# Patient Record
Sex: Female | Born: 1935 | Race: White | Hispanic: No
Health system: Southern US, Community
[De-identification: ages and names within clinical notes are randomized; demographics above are authoritative.]

---

## 2004-02-02 ENCOUNTER — Other Ambulatory Visit: Payer: Self-pay

## 2004-09-13 ENCOUNTER — Emergency Department: Payer: Self-pay | Admitting: Emergency Medicine

## 2005-06-17 ENCOUNTER — Ambulatory Visit: Payer: Self-pay | Admitting: Anesthesiology

## 2005-07-25 ENCOUNTER — Emergency Department: Payer: Self-pay | Admitting: Internal Medicine

## 2005-08-16 ENCOUNTER — Ambulatory Visit: Payer: Self-pay | Admitting: Anesthesiology

## 2005-08-26 ENCOUNTER — Ambulatory Visit: Payer: Self-pay | Admitting: Anesthesiology

## 2005-10-05 ENCOUNTER — Ambulatory Visit: Payer: Self-pay | Admitting: Anesthesiology

## 2005-11-30 ENCOUNTER — Emergency Department: Payer: Self-pay | Admitting: General Practice

## 2005-12-14 ENCOUNTER — Ambulatory Visit: Payer: Self-pay | Admitting: Anesthesiology

## 2006-02-24 ENCOUNTER — Ambulatory Visit: Payer: Self-pay | Admitting: Anesthesiology

## 2006-04-05 ENCOUNTER — Ambulatory Visit: Payer: Self-pay | Admitting: Anesthesiology

## 2006-05-12 ENCOUNTER — Ambulatory Visit: Payer: Self-pay | Admitting: Anesthesiology

## 2006-07-04 ENCOUNTER — Emergency Department: Payer: Self-pay | Admitting: Emergency Medicine

## 2006-07-05 ENCOUNTER — Other Ambulatory Visit: Payer: Self-pay

## 2006-11-09 ENCOUNTER — Ambulatory Visit: Payer: Self-pay | Admitting: Anesthesiology

## 2006-11-14 ENCOUNTER — Other Ambulatory Visit: Payer: Self-pay

## 2006-11-14 ENCOUNTER — Inpatient Hospital Stay: Payer: Self-pay | Admitting: Internal Medicine

## 2007-01-13 ENCOUNTER — Inpatient Hospital Stay: Payer: Self-pay | Admitting: Internal Medicine

## 2007-01-13 ENCOUNTER — Other Ambulatory Visit: Payer: Self-pay

## 2007-01-15 ENCOUNTER — Other Ambulatory Visit: Payer: Self-pay

## 2007-05-28 ENCOUNTER — Other Ambulatory Visit: Payer: Self-pay

## 2007-05-28 ENCOUNTER — Inpatient Hospital Stay: Payer: Self-pay | Admitting: Internal Medicine

## 2007-05-29 ENCOUNTER — Other Ambulatory Visit: Payer: Self-pay

## 2007-06-08 ENCOUNTER — Other Ambulatory Visit: Payer: Self-pay

## 2007-06-08 ENCOUNTER — Inpatient Hospital Stay: Payer: Self-pay | Admitting: Internal Medicine

## 2007-07-06 ENCOUNTER — Other Ambulatory Visit: Payer: Self-pay

## 2007-07-07 ENCOUNTER — Inpatient Hospital Stay: Payer: Self-pay | Admitting: Internal Medicine

## 2007-07-08 ENCOUNTER — Other Ambulatory Visit: Payer: Self-pay

## 2007-07-09 ENCOUNTER — Other Ambulatory Visit: Payer: Self-pay

## 2007-09-05 ENCOUNTER — Other Ambulatory Visit: Payer: Self-pay

## 2007-09-05 ENCOUNTER — Inpatient Hospital Stay: Payer: Self-pay | Admitting: Internal Medicine

## 2009-06-01 IMAGING — US US CAROTID DUPLEX BILAT
1 series · 17 of 24 positions shown · non-contrast
Comparison: none

REASON FOR EXAM: cva
COMMENTS:

[Series 1: us carotid duplex bilat · 17 of 71 slices shown]
[im 1/71]
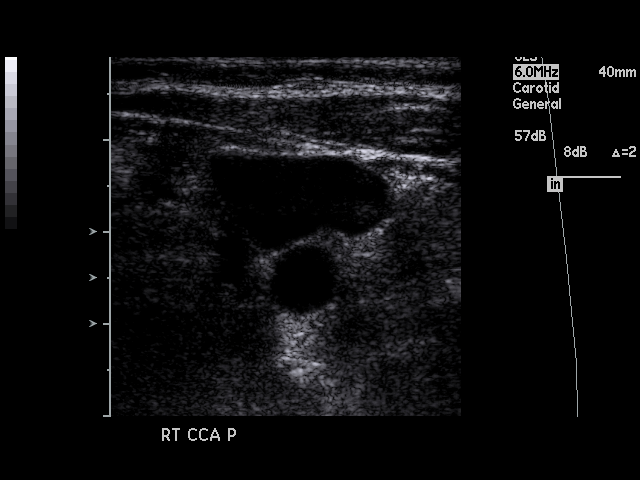
[im 7/71]
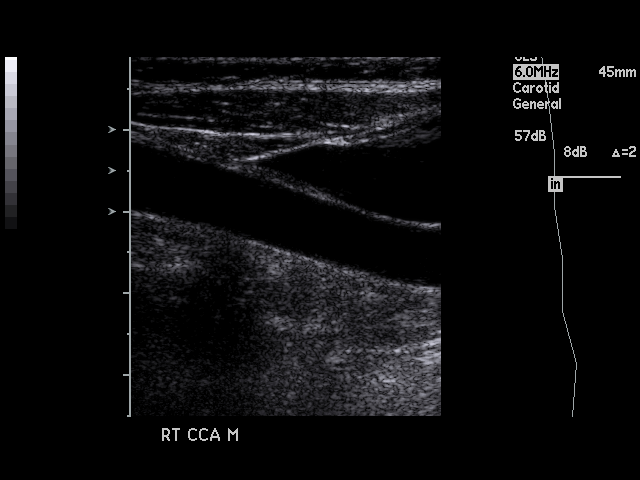
[im 10/71]
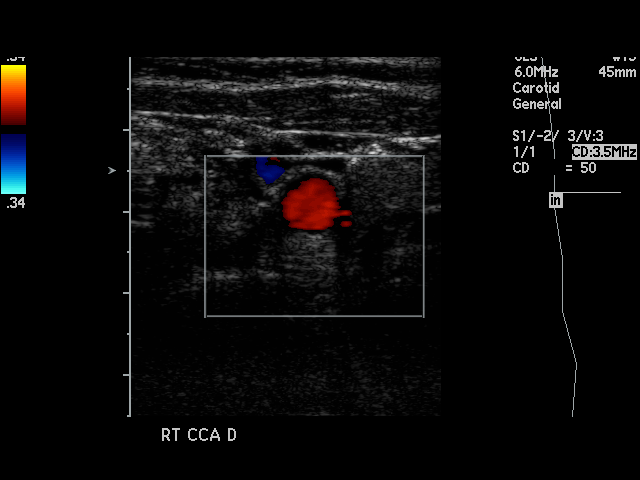
[im 13/71]
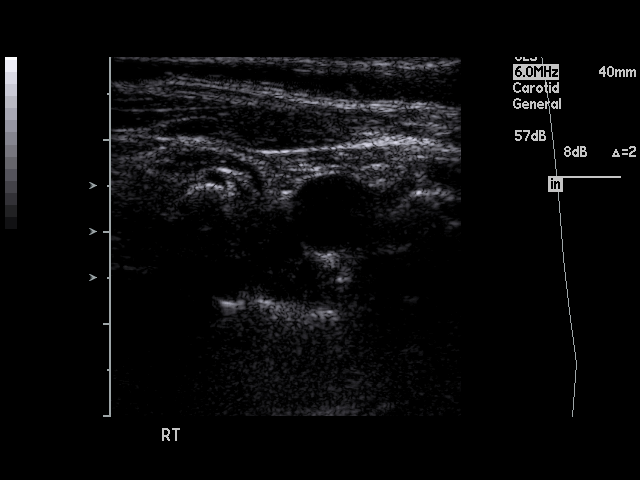
[im 19/71]
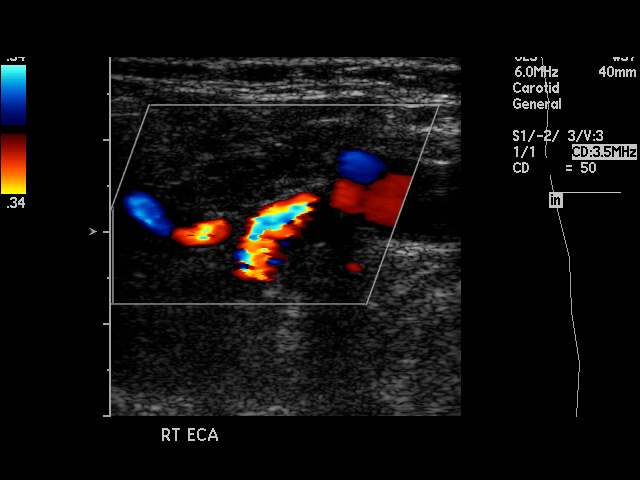
[im 22/71]
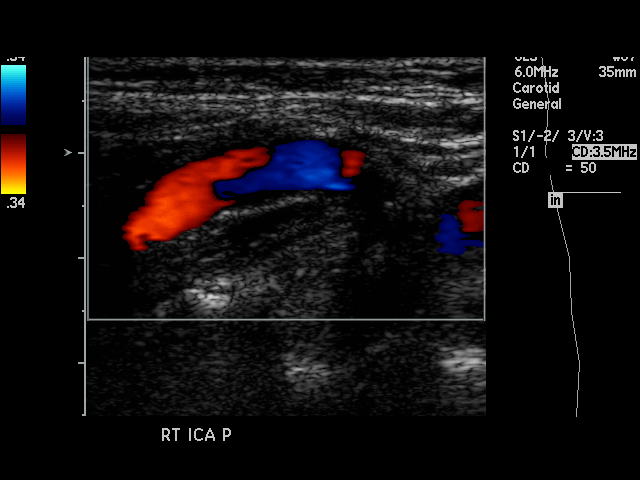
[im 28/71]
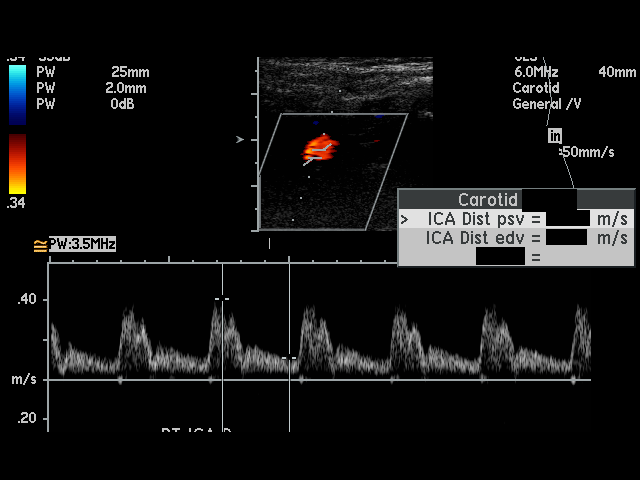
[im 31/71]
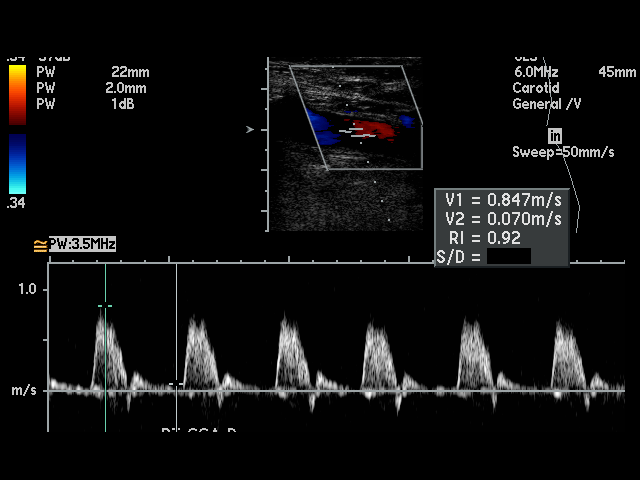
[im 37/71]
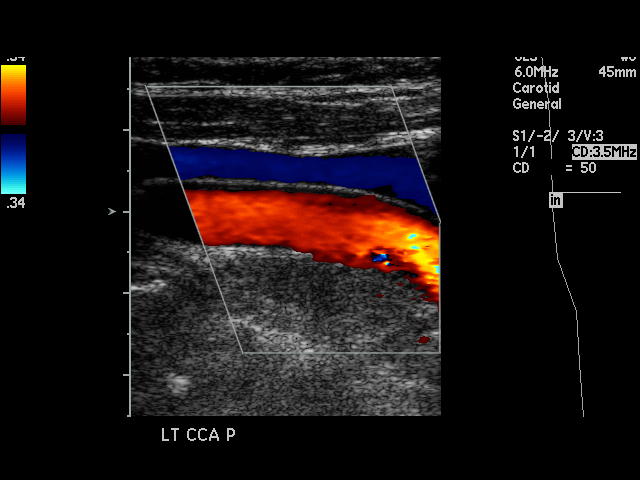
[im 40/71]
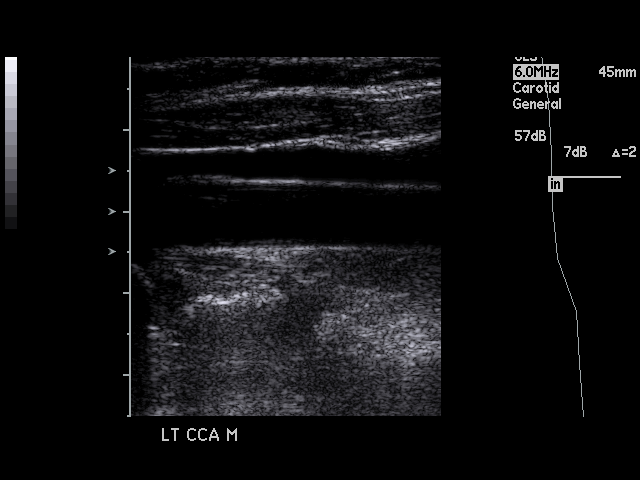
[im 43/71]
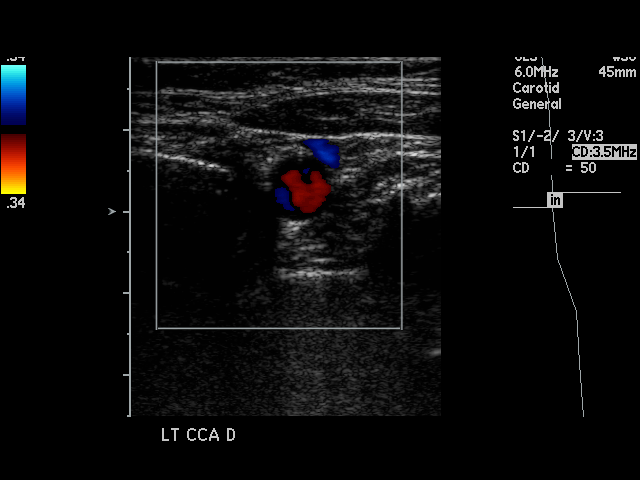
[im 49/71]
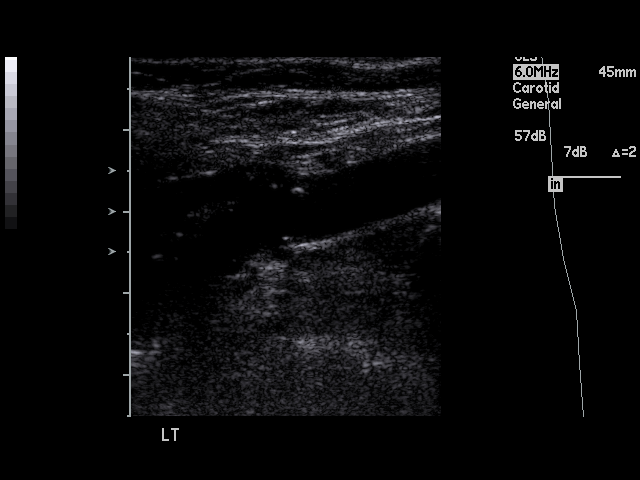
[im 52/71]
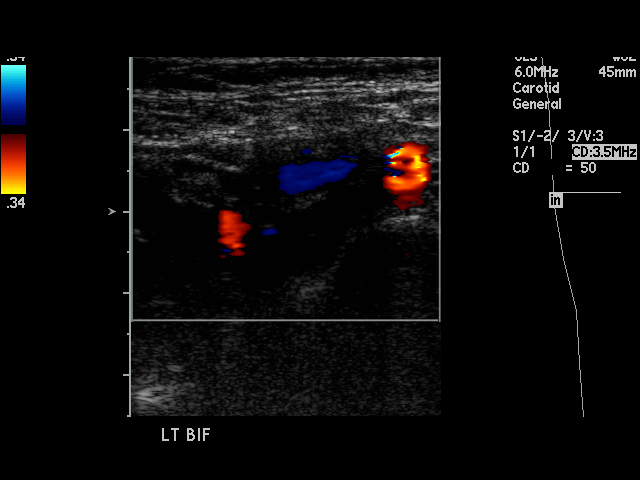
[im 58/71]
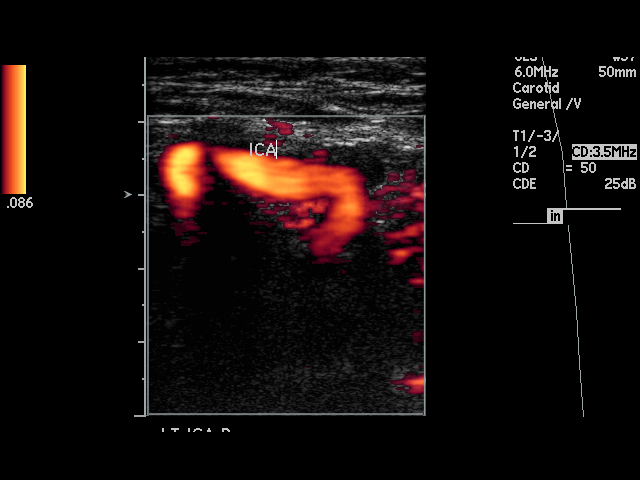
[im 61/71]
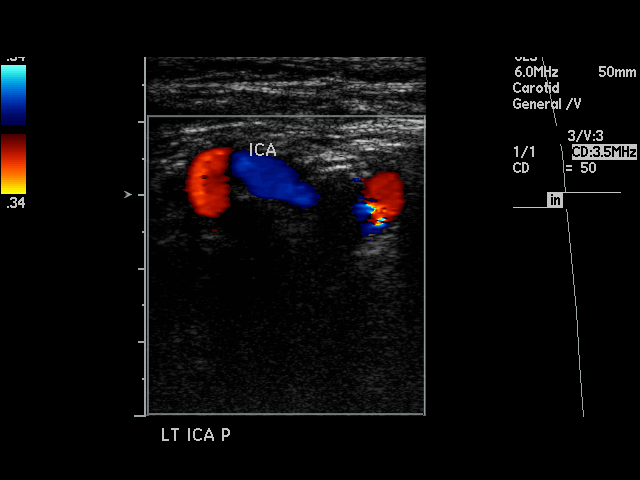
[im 64/71]
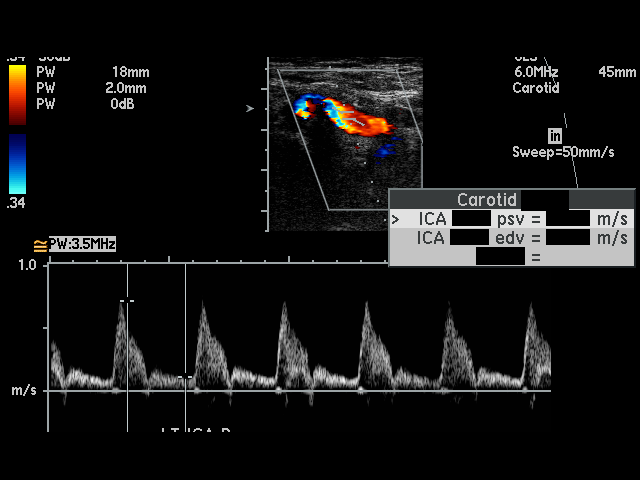
[im 71/71]
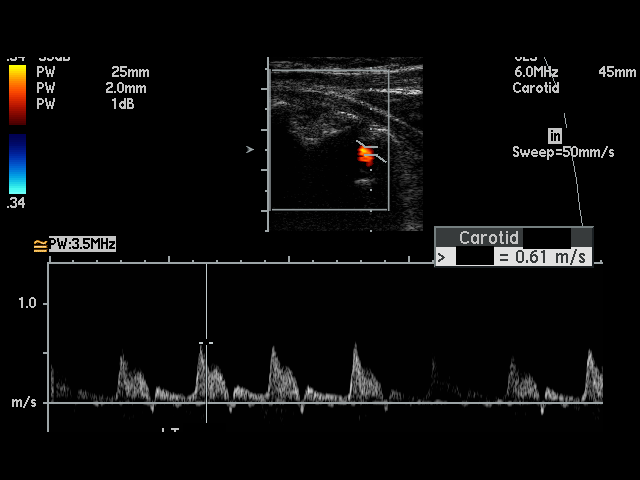

[17 of 24 positions shown; findings below may reference images not displayed]

PROCEDURE:     US  - US CAROTID DOPPLER BILATERAL  - May 29, 2007  [DATE]

RESULT:     There is noted slight calcific plaque formation about the
carotid bifurcations bilaterally.

On the RIGHT, the peak RIGHT common artery flow velocity measures
meter/second and the peak RIGHT common carotid artery flow velocity measures
0.638 meters/sec. IC/CC ratio is 0.750.

On the LEFT, the peak LEFT common carotid artery flow velocity measures
1.190 meters/second and the peak LEFT internal carotid artery flow velocity
measures 0.765 meters/second.  IC/CC ratio is 0.643. These values
bilaterally are in the normal range and consistent with the absence of
hemodynamically significant stenosis. Antegrade flow is noted in both
vertebrals.
IMPRESSION: 1.     Slight plaque formation is observed bilaterally.
2.     No hemodynamically significant stenosis is identified on either side.
3.     Antegrade flow is present in both vertebrals.

## 2010-05-13 ENCOUNTER — Emergency Department: Payer: Self-pay | Admitting: Emergency Medicine

## 2010-06-17 ENCOUNTER — Emergency Department: Payer: Self-pay | Admitting: Emergency Medicine

## 2011-05-24 ENCOUNTER — Ambulatory Visit: Payer: Self-pay | Admitting: Family Medicine

## 2011-05-28 ENCOUNTER — Ambulatory Visit: Payer: Self-pay | Admitting: Internal Medicine

## 2011-11-16 ENCOUNTER — Ambulatory Visit: Payer: Self-pay | Admitting: General Surgery

## 2014-10-23 ENCOUNTER — Emergency Department: Payer: Self-pay | Admitting: Emergency Medicine

## 2014-10-23 LAB — CBC
HCT: 39.7 % (ref 35.0–47.0)
HGB: 12.7 g/dL (ref 12.0–16.0)
MCH: 29.4 pg (ref 26.0–34.0)
MCHC: 32.1 g/dL (ref 32.0–36.0)
MCV: 92 fL (ref 80–100)
Platelet: 187 10*3/uL (ref 150–440)
RBC: 4.34 10*6/uL (ref 3.80–5.20)
RDW: 14.2 % (ref 11.5–14.5)
WBC: 8.8 10*3/uL (ref 3.6–11.0)

## 2014-10-23 LAB — BASIC METABOLIC PANEL
Anion Gap: 6 — ABNORMAL LOW (ref 7–16)
BUN: 15 mg/dL (ref 7–18)
Calcium, Total: 9 mg/dL (ref 8.5–10.1)
Chloride: 102 mmol/L (ref 98–107)
Co2: 29 mmol/L (ref 21–32)
Creatinine: 0.88 mg/dL (ref 0.60–1.30)
EGFR (African American): >60
EGFR (Non-African Amer.): >60
GLUCOSE: 90 mg/dL (ref 65–99)
Osmolality: 274 (ref 275–301)
Potassium: 4.1 mmol/L (ref 3.5–5.1)
SODIUM: 137 mmol/L (ref 136–145)

## 2014-10-23 LAB — URINALYSIS, COMPLETE
BILIRUBIN, UR: NEGATIVE
Glucose,UR: NEGATIVE mg/dL (ref 0–75)
Hyaline Cast: 5
Ketone: NEGATIVE
Leukocyte Esterase: NEGATIVE
Nitrite: NEGATIVE
Ph: 7 (ref 4.5–8.0)
Protein: NEGATIVE
RBC,UR: 1 /HPF (ref 0–5)
Specific Gravity: 1.011 (ref 1.003–1.030)

## 2014-10-23 LAB — PROTIME-INR
INR: 1
Prothrombin Time: 12.9 secs (ref 11.5–14.7)

## 2014-10-23 LAB — TROPONIN I: Troponin-I: 0.03 ng/mL

## 2015-01-06 ENCOUNTER — Ambulatory Visit: Payer: Self-pay | Admitting: Internal Medicine

## 2015-01-15 ENCOUNTER — Inpatient Hospital Stay: Payer: Self-pay | Admitting: Internal Medicine

## 2015-01-25 ENCOUNTER — Ambulatory Visit: Payer: Self-pay | Admitting: Family Medicine

## 2015-01-25 ENCOUNTER — Emergency Department: Payer: Self-pay | Admitting: Emergency Medicine

## 2015-01-26 ENCOUNTER — Emergency Department: Payer: Self-pay | Admitting: Emergency Medicine

## 2015-02-04 ENCOUNTER — Ambulatory Visit
Admit: 2015-02-04 | Disposition: A | Discharge status: Home / Self Care | Payer: Self-pay | Attending: Internal Medicine | Admitting: Internal Medicine

## 2015-02-27 ENCOUNTER — Emergency Department: Payer: Self-pay | Admitting: Emergency Medicine

## 2015-02-27 LAB — BASIC METABOLIC PANEL
ANION GAP: 9 (ref 7–16)
BUN: 11 mg/dL
CALCIUM: 9 mg/dL
Chloride: 98 mmol/L — ABNORMAL LOW
Co2: 26 mmol/L
Creatinine: 0.52 mg/dL
EGFR (African American): >60
EGFR (Non-African Amer.): >60
Glucose: 111 mg/dL — ABNORMAL HIGH
POTASSIUM: 3.8 mmol/L
Sodium: 133 mmol/L — ABNORMAL LOW

## 2015-02-27 LAB — URINALYSIS, COMPLETE
BILIRUBIN, UR: NEGATIVE
Blood: NEGATIVE
Glucose,UR: NEGATIVE mg/dL (ref 0–75)
KETONE: NEGATIVE
Nitrite: POSITIVE
Ph: 6 (ref 4.5–8.0)
Protein: NEGATIVE
RBC, UR: NONE SEEN /HPF (ref 0–5)
Specific Gravity: 1.017 (ref 1.003–1.030)
Squamous Epithelial: 4

## 2015-02-27 LAB — CBC
HCT: 41.6 % (ref 35.0–47.0)
HGB: 13.2 g/dL (ref 12.0–16.0)
MCH: 28.4 pg (ref 26.0–34.0)
MCHC: 31.8 g/dL — ABNORMAL LOW (ref 32.0–36.0)
MCV: 89 fL (ref 80–100)
Platelet: 245 10*3/uL (ref 150–440)
RBC: 4.65 10*6/uL (ref 3.80–5.20)
RDW: 14.8 % — ABNORMAL HIGH (ref 11.5–14.5)
WBC: 11 10*3/uL (ref 3.6–11.0)

## 2015-02-27 LAB — CLOSTRIDIUM DIFFICILE(ARMC)

## 2015-03-06 ENCOUNTER — Emergency Department
Admit: 2015-03-06 | Disposition: A | Discharge status: Home / Self Care | Payer: Self-pay | Admitting: Emergency Medicine

## 2015-03-06 LAB — CBC WITH DIFFERENTIAL/PLATELET
Basophil #: 0.1 10*3/uL (ref 0.0–0.1)
Basophil %: 0.6 %
EOS PCT: 2.4 %
Eosinophil #: 0.2 10*3/uL (ref 0.0–0.7)
HCT: 35.6 % (ref 35.0–47.0)
HGB: 11.7 g/dL — ABNORMAL LOW (ref 12.0–16.0)
LYMPHS PCT: 12.3 %
Lymphocyte #: 1 10*3/uL (ref 1.0–3.6)
MCH: 29 pg (ref 26.0–34.0)
MCHC: 32.8 g/dL (ref 32.0–36.0)
MCV: 88 fL (ref 80–100)
MONOS PCT: 8.2 %
Monocyte #: 0.7 x10 3/mm (ref 0.2–0.9)
NEUTROS ABS: 6.2 10*3/uL (ref 1.4–6.5)
Neutrophil %: 76.5 %
PLATELETS: 228 10*3/uL (ref 150–440)
RBC: 4.03 10*6/uL (ref 3.80–5.20)
RDW: 15 % — ABNORMAL HIGH (ref 11.5–14.5)
WBC: 8.2 10*3/uL (ref 3.6–11.0)

## 2015-03-06 LAB — BASIC METABOLIC PANEL
Anion Gap: 7 (ref 7–16)
BUN: 20 mg/dL
CREATININE: 0.64 mg/dL
Calcium, Total: 8.7 mg/dL — ABNORMAL LOW
Chloride: 95 mmol/L — ABNORMAL LOW
Co2: 28 mmol/L
EGFR (African American): >60
EGFR (Non-African Amer.): >60
Glucose: 130 mg/dL — ABNORMAL HIGH
POTASSIUM: 3.6 mmol/L
Sodium: 130 mmol/L — ABNORMAL LOW

## 2015-03-06 LAB — URINALYSIS, COMPLETE
Bilirubin,UR: NEGATIVE
Glucose,UR: NEGATIVE mg/dL (ref 0–75)
Ketone: NEGATIVE
Nitrite: NEGATIVE
Ph: 5 (ref 4.5–8.0)
Protein: NEGATIVE
SPECIFIC GRAVITY: 1.02 (ref 1.003–1.030)
Squamous Epithelial: 1

## 2015-03-08 LAB — URINE CULTURE

## 2015-04-06 NOTE — Consult Note (Signed)
Referring Physician:  Walker, III, John B :   Primary Care Physician:  Walker, III, John B : [facility], [address], [place], [place] [address], [phone]  Reason for Consult: Admit Date: 15-Jan-2015  Chief Complaint: leg weakness  Reason for Consult: CVA   History of Present Illness: History of Present Illness:   81 yo RHD F presents to [facility] secondary to leg weakness and urinary retention.  Pt has hx of neck problems since she fell and broke her neck in 2002.  Pt always complains of neck pain and occasional back pain.  Pt has had progressive weakness in her legs and then her arms over the past 2 weeks.  Pt also developed urinary retention as well.  Pt used to walk with a walker up until about 2 weeks ago and has a therapist who comes to the house and noted decreased movement.  ROS:  General denies complaints   HEENT no complaints   Lungs no complaints   Cardiac no complaints   GI no complaints   GU no complaints   Musculoskeletal back pain  neck pain   Extremities no complaints   Skin no complaints   Neuro no complaints   Past Medical/Surgical Hx:  cervical spine surgery:   PVD:   ventricular ectopy:   hyperlipidemia, peripheral vascular disease, aphasia:   skin cancer on face, removed:   breast cancer 26 years ago:   Osteoporosis:   Arthritis:   Hypercholesterolemia:   Hypertension:   fractured neck with screw in place:   shingles:   cholecystectomy:   cervical spine, cholecstectomy, mastectomy, knee replacement:   tumors removed from "stomach":   mastectomy (left):   Hysterectomy - Total:   right knee replacement 6-7 years ago:   Past Medical/ Surgical Hx:  Past Medical History personally reviewed by me as above   Past Surgical History personally reviewed by me as above   Home Medications: Medication Instructions Last Modified Date/Time  furosemide 40 mg oral tablet 1 tab(s) orally once a day (in the morning) 10-Feb-16 21:32   potassium chloride 10 mEq oral tablet, extended release 1 tab(s) orally once a day (in the morning) 10-Feb-16 21:32  naproxen 500 mg oral tablet 1 tab(s) orally 2 times a day 10-Feb-16 21:32  levETIRAcetam 500 mg oral tablet 1 tab(s) orally 2 times a day 10-Feb-16 21:32  acetaminophen-HYDROcodone 325 mg-5 mg oral tablet 1 tab(s) orally every 4 hours, As Needed - for Pain 10-Feb-16 21:32  aspirin 81 mg oral tablet 1 tab(s) orally once a day (in the morning) 10-Feb-16 21:32  atorvastatin 40 mg oral tablet 1 tab(s) orally once a day (in the morning) 10-Feb-16 21:32  Avapro 150 mg oral tablet 1 tab(s) orally once a day (in the morning) 10-Feb-16 21:32  Calcium 600+D 1 tab(s) orally once a day (in the morning) 10-Feb-16 21:32  clopidogrel 75 mg oral tablet 1 tab(s) orally once a day (in the morning) 10-Feb-16 21:32  Crestor 20 mg oral tablet 1 tab(s) orally once a day (in the morning) 10-Feb-16 21:32   Allergies:  No Known Allergies:   Allergies:  Allergies NKDA   Social/Family History: Employment Status: retired  Lives With: children  Living Arrangements: house  Social History: no tob, no EtOH, no illicits  Family History: no seizures, no stroke   Vital Signs: **Vital Signs.:   11-Feb-16 14:05  Vital Signs Type Routine  Temperature Temperature (F) 98  Celsius 36.6  Temperature Source oral  Pulse Pulse 76    Respirations Respirations 18  Systolic BP Systolic BP 104  Diastolic BP (mmHg) Diastolic BP (mmHg) 63  Mean BP 76  Pulse Ox % Pulse Ox % 93  Pulse Ox Activity Level  At rest  Oxygen Delivery Room Air/ 21 %   Physical Exam: General: obese, mild distress  HEENT: normocephalic, sclera nonicteric, oropharynx clear  Neck: supple, no JVD, no bruits  Chest: CTA B, no wheezing  Cardiac: RRR, no murmurs, no edema, 2+ pulses  Extremities: no C/C/E, FROM   Neurologic Exam: Mental Status: alert and oriented x 2 not time, normal  language, follows complex commands, mild dysarthria   Cranial Nerves: PERRLA, EOMI, nl VF, face symmetric with mild hypomimia, tongue midline, shoulder shrug equal  Motor Exam: 4/5 distal B UE and LE, 2/5 proximal B UE and LE, increased tone slightly, resting tremor and some bradykinesia  Deep Tendon Reflexes: 2+/4 B, plantars Babinski B, R hoffmann  Sensory Exam: decreased vibration below hips, nl temp and pin  Coordination: unobtainable secondary to weakness   Lab Results: Hepatic:  10-Feb-16 18:10   Bilirubin, Total 0.3  Alkaline Phosphatase 69  SGPT (ALT) 26  SGOT (AST)  39  Total Protein, Serum  6.2  Albumin, Serum  2.9  Routine Chem:  10-Feb-16 18:10   Result Comment POTASSIUM/BUN/CREATININE - Slight hemolysis, interpret results with AST/TOTAL PROTEIN - caution.  Result(s) reported on 15 Jan 2015 at 07:03PM.  Lipase 177 (Result(s) reported on 15 Jan 2015 at 06:56PM.)  11-Feb-16 05:28   Glucose, Serum 84  BUN  23  Creatinine (comp) 0.97  Sodium, Serum 140  Potassium, Serum 4.4  Chloride, Serum 103  CO2, Serum 32  Calcium (Total), Serum 9.1  Anion Gap  5  Osmolality (calc) 282  eGFR (African American) >60  eGFR (Non-African American)  57 (eGFR values <60mL/min/1.73 m2 may be an indication of chronic kidney disease (CKD). Calculated eGFR, using the MRDR Study equation, is useful in  patients with stable renal function. The eGFR calculation will not be reliable in acutely ill patients when serum creatinine is changing rapidly. It is not useful in patients on dialysis. The eGFR calculation may not be applicable to patients at the low and high extremes of body sizes, pregnant women, and vegetarians.)  Cardiac:  10-Feb-16 18:10   Troponin I < 0.02 (0.00-0.05 0.05 ng/mL or less: NEGATIVE  Repeat testing in 3-6 hrs  if clinically indicated. >0.05 ng/mL: POTENTIAL  MYOCARDIAL INJURY. Repeat  testing in 3-6 hrs if  clinically indicated. NOTE: An increase or decrease  of 30% or more on serial  testing suggests a   clinically important change)  11-Feb-16 05:28   CK, Total 67 (Result(s) reported on 16 Jan 2015 at 09:04AM.)  Routine UA:  10-Feb-16 18:10   Color (UA) Straw  Clarity (UA) Clear  Glucose (UA) Negative  Bilirubin (UA) Negative  Ketones (UA) Negative  Specific Gravity (UA) 1.012  Blood (UA) 1+  pH (UA) 5.0  Protein (UA) Negative  Nitrite (UA) Negative  Leukocyte Esterase (UA) Negative (Result(s) reported on 15 Jan 2015 at 07:33PM.)  RBC (UA) 3 /HPF  WBC (UA) NONE SEEN  Bacteria (UA) NONE SEEN  Epithelial Cells (UA) 2 /HPF  Mucous (UA) PRESENT (Result(s) reported on 15 Jan 2015 at 07:33PM.)  Routine Coag:  10-Feb-16 18:10   Prothrombin 12.3 (11.4-15.0 NOTE: New Reference Range  01/03/15)  INR 0.9 (INR reference interval applies to patients on anticoagulant therapy. A single INR therapeutic range for coumarins is not optimal   for all indications; however, the suggested range for most indications is 2.0 - 3.0. Exceptions to the INR Reference Range may include: Prosthetic heart valves, acute myocardial infarction, prevention of myocardial infarction, and combinations of aspirin and anticoagulant. The need for a higher or lower target INR must be assessed individually. Reference: The Pharmacology and Management of the Vitamin K  antagonists: the seventh ACCP Conference on Antithrombotic and Thrombolytic Therapy. Chest.2004 Sept:126 (3suppl): 2045-2335. A HCT value >55% may artifactually increase the PT.  In one study,  the increase was an average of 25%. Reference:  "Effect on Routine and Special Coagulation Testing Values of Citrate Anticoagulant Adjustment in Patients with High HCT Values." American Journal of Clinical Pathology 2006;126:400-405.)  Activated PTT (APTT) 27.4 (A HCT value >55% may artifactually increase the APTT. In one study, the increase was an average of 19%. Reference: "Effect on Routine and Special Coagulation Testing Values of Citrate Anticoagulant  Adjustment in Patients with High HCT Values." American Journal of Clinical Pathology 2006;126:400-405.)  Routine Hem:  11-Feb-16 05:28   Erythrocyte Sed Rate 17 (Result(s) reported on 16 Jan 2015 at 09:13AM.)  WBC (CBC) 4.5  RBC (CBC) 4.08  Hemoglobin (CBC)  11.9  Hematocrit (CBC) 36.8  Platelet Count (CBC) 194  MCV 90  MCH 29.2  MCHC 32.4  RDW 14.0  Neutrophil % 65.1  Lymphocyte % 19.3  Monocyte % 10.1  Eosinophil % 4.8  Basophil % 0.7  Neutrophil # 2.9  Lymphocyte #  0.9  Monocyte # 0.5  Eosinophil # 0.2  Basophil # 0.0 (Result(s) reported on 16 Jan 2015 at 06:24AM.)   Radiology Results: CT:    10-Feb-16 18:55, CT Head Without Contrast  CT Head Without Contrast   REASON FOR EXAM:    generalized weakness  COMMENTS:       PROCEDURE: CT  - CT HEAD WITHOUT CONTRAST  - Jan 15 2015  6:55PM     CLINICAL DATA:  Generalized weakness.    EXAM:  CT HEAD WITHOUT CONTRAST    TECHNIQUE:  Contiguous axial images were obtained from the base of the skull  through the vertex without intravenous contrast.    COMPARISON:  CT scan dated 10/23/2014  FINDINGS:  There is no acute intracranial hemorrhage, infarction, or mass  lesion. There is moderate cerebral cortical and cerebellar atrophy  with secondary ventricular dilatation. Extensive periventricular  white matter lucency consistent with chronic small vessel ischemic  disease, stable. Benign calcifications in the basal ganglia, stable.    No acute osseous abnormality.     IMPRESSION:  No acute abnormality. Diffuse atrophy with extensive chronic small  vessel ischemic disease.      Electronically Signed    By: James  Maxwell M.D.    On: 01/15/2015 19:09         Verified By: JAMES H. MAXWELL, M.D.,   Radiology Impression: Radiology Impression: MRI of c-spine and L-spine show severe spinal stenosis   Impression/Recommendations: Recommendations:   prior notes reviewed by me reviewed by me   Cervical myelopathy-   this is very symptomatic and likely causing weakness in arms and legs as well as urinary retention;  unfortunately I dont think pt would be a surgical candidate Lumbar stenosis-  severe as well but not as symptomatic as 1. and could cause some more leg weakness but not urinary retention or arm weakness Parkinsonism-  pt has hx of some of these tremors recommend Neurosurgical input as in/outpatient start decadron 4mg q6h start Sinemet 25/100mg TID needs   all therapy modalities check B12/folate and TSH will follow  Electronic Signatures: Smith, Matthew C (MD)  (Signed 11-Feb-16 14:51)  Authored: REFERRING PHYSICIAN, Primary Care Physician, Consult, History of Present Illness, Review of Systems, PAST MEDICAL/SURGICAL HISTORY, HOME MEDICATIONS, ALLERGIES, Social/Family History, NURSING VITAL SIGNS, Physical Exam-, LAB RESULTS, RADIOLOGY RESULTS, Recommendations   Last Updated: 11-Feb-16 14:51 by Smith, Matthew C (MD) 

## 2015-04-06 NOTE — Discharge Summary (Signed)
PATIENT NAME:  Laura Fowler, WIX MR#:  [id number] DATE OF BIRTH:  [birth year]  DATE OF ADMISSION:  01/15/2015 DATE OF DISCHARGE:  01/20/2015  FINAL DIAGNOSES: 1.  Cervical myelopathy.  2.  Severe cervical spinal stenosis as cause of #1.  3.  Lumbar spinal stenosis.  4.  Urinary retention secondary to cervical myelopathy.  5.  Metabolic encephalopathy.  6.  Coronary artery disease with prior stent placement.  7.  Peripheral vascular disease.  8.  Hypertension.  9.  Hyperlipidemia.  10.  History of neck fracture.  11.  Parkinson disease.   HISTORY AND PHYSICAL: Please see dictated admission history and physical.   HOSPITAL COURSE: The patient was brought in with urinary retention, with progressive weakness in arms and legs. The Foley catheter was placed with well over 600 mL in the bladder. The case was discussed with urology who recommended Foley be left in place at this point with follow-up outpatient with urology to remove the Foley and perform voiding dynamics. Neurology saw the patient and the patient underwent MRI of the cervical, thoracic and lumbar spine, revealing severe cervical spinal stenosis as well as severe lumbar spinal stenosis. Her urinary retention and bilateral arm involvement suggests that the cervical myelopathy was the major source of her progressive symptoms. The case was discussed at length with the patient and her family members. She does not appear to be a surgical candidate and so neurosurgery was not involved. She did have some facial features and movements, which appeared to be parkinsonian and was placed on Sinemet, and did have some improvement in the fluidity of her motion. She was placed on steroids briefly and this was tapered down over the course of her hospitalization; however, she did have some trouble with confusion when on these medications.   Physical therapy worked with the patient and it became clear that she would need skilled nursing, and may in fact need  long-term care beyond that depending on her progress, and so at this point she will be discharged to skilled nursing facility in stable condition with her physical activity to be up with assistance as tolerated. Physical therapy, occupational therapy and speech therapy should work with the patient. She will have the Foley in place for now and this should remain in place until she sees urology, and an outpatient appointment was made. Her diet should be mechanical soft, regular diet.   DISCHARGE MEDICATIONS: 1.  Keppra 500 mg p.o. b.i.d. for history of seizure disorder.  2.  Aspirin 81 mg p.o. daily.  3.  Calcium 600 mg p.o. daily.  4.  Plavix 75 mg p.o. daily.  5.  Norco 5/325 one p.o. every 4 hours p.r.n. severe pain.  6.  Atorvastatin 40 mg p.o. daily.  7.  Sinemet 25/100 one p.o. t.i.d. with meals.  8.  Colace 10 mg p.o. b.i.d.   The patient was taken off of Avapro as blood pressures were low. She did not require Lasix or potassium, though these may be reinstituted if she has fluid retention. Recommendation was to avoid further naproxen and atorvastatin was being used in the place of Crestor.   Given her severe muscle weakness, overall poor prognosis, however, the decision certainly can be made to consider coming off of statin therapy in the future depending on her progress.   Palliative saw the patient during this hospitalization and she was NO CODE BLUE. An out of facility form was sent with her to the nursing facility.   TIME SPENT ON  DISCHARGE: 30 minutes.   ____________________________ Curtis SitesBert J. Klein III, MD [id number] D: 01/20/2015 09:42:08 ET T: 01/20/2015 09:53:59 ET JOB#: [id number]  cc: Curtis SitesBert J. Klein III, MD, <Dictator> Daniel NonesBERT KLEIN MD ELECTRONICALLY SIGNED 01/20/2015 13:23

## 2015-04-06 NOTE — H&P (Signed)
PATIENT NAME:  Laura Fowler, Giovana C MR#:  [id number] DATE OF BIRTH:  09/13/1923  DATE OF ADMISSION:  01/15/2015  REFERRING PHYSICIAN:  Toney RakesEryka Gayle, MD.  PRIMARY CARE PHYSICIAN: Lourdes SledgeWalker, [facility].   CHIEF COMPLAINT: Weakness, inability to urinate.    HISTORY OF PRESENT ILLNESS:  The patient is an 81 year old Caucasian female with a history of coronary artery disease status post PCI and stent placement, essential hypertension, hyperlipidemia unspecified, presenting with inability to urinate. She has had progressively worsening weakness for 1 week total duration with associated lower extremity edema, saw her PCP for the above symptoms, started on Lasix 40 mg p.o. daily. Had some initial improvement, however despite this her weakness remained and even worsened. No further symptomatology for the past few days, however daughter at bedside notes that she has had urinary retention for 1 day in total, last urination at 4:00 a.m. the day of admission to the hospital. The patient herself complained of mild suprapubic pain, the pain is cramping and fullness in quality, 3-4 intensity, nonradiating, no worsening or relieving factors. Foley catheter placed in the Emergency Department had 600 mL of urine immediately drained. She denies any further symptomatology at this time other than generalized weakness.   REVIEW OF SYSTEMS:   CONSTITUTIONAL: Positive for fatigue, weakness. Denies fevers, chills.  EYES: Denies blurred vision, double vision, eye pain.  EARS, NOSE, AND THROAT: Denies pain, tinnitus or hearing loss.   RESPIRATORY:  Denies cough, wheeze, shortness of breath.  CARDIOVASCULAR: Denies chest pain, palpitations. Positive for edema.  GASTROINTESTINAL: Denies nausea, vomiting, diarrhea, or abdominal pain.  GENITOURINARY: Denies dysuria, hematuria. Positive for inability to urinate as stated above.  ENDOCRINE: Denies nocturia or thyroid problems.   HEMATOLOGIC:  Denies easy bruising or bleeding.  SKIN:  Denies rash or lesions.  MUSCULOSKELETAL: Denies pain in neck, back, shoulder, knees, hips, or arthritic symptoms.  NEUROLOGIC: Denies paralysis, paresthesias.  PSYCHIATRIC: Denies anxiety or depression.    Otherwise full review of systems performed by me is negative.     PAST MEDICAL HISTORY: Coronary artery disease status post PCI and stent placement, peripheral vascular disease, hypertension essential, hyperlipidemia unspecified, history of cervical neck fracture status post surgery.   SOCIAL HISTORY: No alcohol, tobacco, or drug usage. Uses a walker at baseline. Has some mild confusion at baseline per daughter at bedside.   FAMILY HISTORY: Positive for diabetes.   ALLERGIES: No known drug allergies.   HOME MEDICATIONS: Include acetaminophen/hydrocodone 325/5 mg p.o. q. 4 hours as needed for pain, aspirin 81 mg p.o. daily, naproxen 500 mg p.o. b.i.d., Avapro 150 mg p.o. daily, Keppra 500 mg p.o. b.i.d., atorvastatin 40 mg p.o. daily, Lasix 40 mg p.o. daily, potassium 10 mEq p.o. daily, calcium 600 + vitamin D 1 tablet p.o. daily.   PHYSICAL EXAMINATION:  VITAL SIGNS: Temperature 97.3, heart rate 72, respirations 18, blood pressure 133/56, saturating 96% on room air. Weight 84.4 kilos, BMI 29.2.  GENERAL: Well-nourished, obese Caucasian female, currently in no acute distress.  HEAD: Normocephalic, atraumatic.  EYES: Pupils equal, round, reactive to light. Extraocular muscles intact. No scleral icterus.  MOUTH: Dry mucosal membrane. Dentition intact. No abscess noted.  EARS, NOSE, AND THROAT: Clear without exudates.  No external lesions.   NECK: Supple. No thyromegaly or nodules. No JVD.  PULMONARY: Coarse breath sounds at the bases bilaterally, poor air entry secondary to poor respiratory effort. No frank wheezes, rales, rhonchi.  CHEST: Nontender to palpation.  CARDIOVASCULAR: S1, S2, regular rate and rhythm. No murmurs, rubs,  or gallops. 2 + edema to the ankles, trace edema to the  shins bilaterally. Pedal pulses 2 + bilaterally.  GASTROINTESTINAL: Soft, nontender, nondistended. No masses. Bowel sounds are positive.  MUSCULOSKELETAL: No swelling, clubbing, or edema other than stated above. Range of motion proximal lower extremity is limited secondary to weakness. NEUROLOGIC: Cranial nerves II through XII intact. No gross focal neurological deficits. Sensation intact. Reflexes intact.  SKIN: No ulcerations, lesions, rash, or cyanosis. Skin warm and dry. Turgor intact.  PSYCHIATRIC: Mood and affect within normal limits.  Alert and oriented x 3.  Insight and judgment intact.   LABORATORY DATA: Sodium of 138, potassium 5, chloride of 101, bicarbonate of 31, BUN 33, creatinine 0.93, glucose of 105. Albumin 2.9, protein is 6.2.  Troponin less than 0.02. WBC 6.3, hemoglobin 12.9, platelets of 212,000.  Urinalysis negative for evidence of infection.   Chest x-ray performed which reveals minimal bibasilar atelectasis. CT head performed which shows no acute intracranial process.   ASSESSMENT AND PLAN: An 81 year old Caucasian female with a history of coronary artery disease status post percutaneous coronary intervention and stent placement, presenting with inability to urinate as well as progressive weakness.   1.  Acute urinary retention. A Foley catheter had been placed in the Emergency Department, 600 mL of urine out.  We will consult urology, follow up ins and outs, hold any potential medications that can be nephrotoxic.  2.  Weakness, generalized. We will get a physical therapy evaluation. She is mildly dehydrated given her elevated BUN, we will hold her diuretics at this time while giving further fluids. 3.  Hypertension, essential. Hold ACE inhibitor given mild prerenal status as indicated by elevated BUN.   4.  Coronary artery disease status post percutaneous coronary intervention and stent placement. Aspirin, statin therapy  Plavix.    5.  VTE prophylaxis heparin subcutaneous   6.  Full code.    TIME SPENT:  45 minutes.     ____________________________ Cletis Athens. Hower, MD dkh:bu D: 01/15/2015 20:47:44 ET T: 01/15/2015 21:21:43 ET JOB#: [id number]  cc: Cletis Athens. Hower, MD, <Dictator> DAVID Synetta Shadow MD ELECTRONICALLY SIGNED 01/16/2015 20:35

## 2015-05-07 DEATH — deceased

## 2017-01-19 IMAGING — MR MRI LUMBAR SPINE WITHOUT CONTRAST
5 series · 40 of 48 positions shown · non-contrast
Comparison: None.

CLINICAL DATA: Rapidly progressing leg weakness with urinary
retention.

EXAM:
MRI LUMBAR SPINE WITHOUT CONTRAST
TECHNIQUE: Multiplanar, multisequence MR imaging of the lumbar spine was
performed. No intravenous contrast was administered.

[Series 2: T2 · sagittal · 4.0mm · 0.81mm/px · 6 of 18 slices shown (1 of 2)]
[im 1/18]
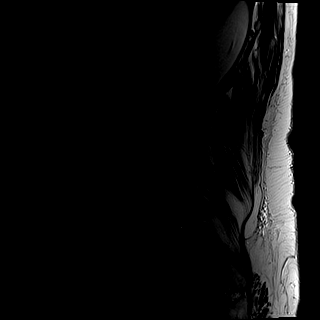
[im 4/18]
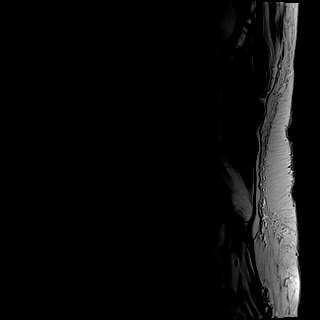
[im 7/18]
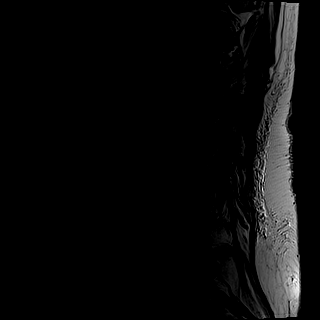
[im 11/18]
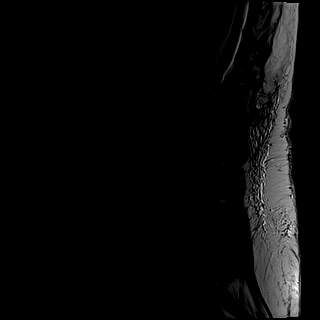
[im 14/18]
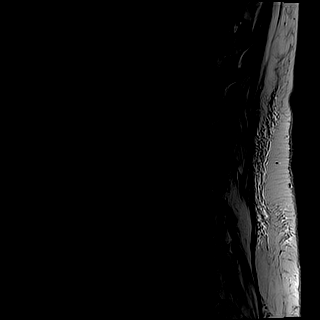
[im 18/18]
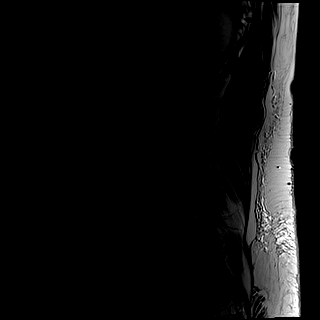

[Series 3: T1 · sagittal · 4.0mm · 1.02mm/px · 7 of 18 slices shown (1 of 2)]
[im 1/18]
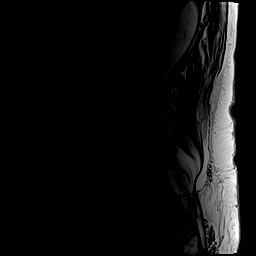
[im 3/18]
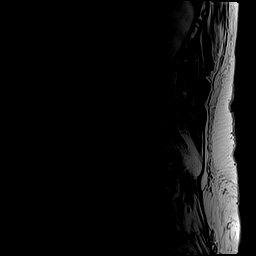
[im 6/18]
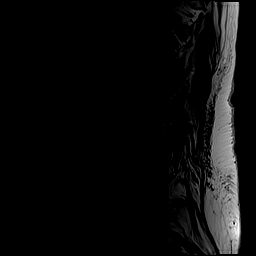
[im 9/18]
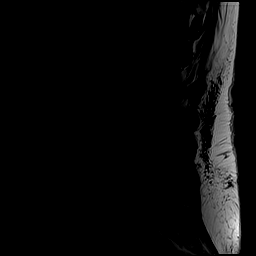
[im 12/18]
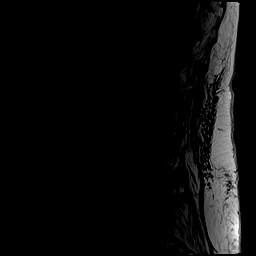
[im 15/18]
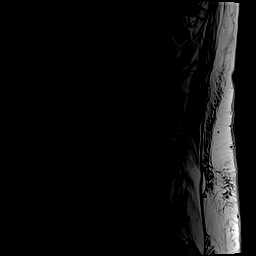
[im 18/18]
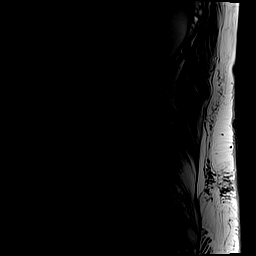

[Series 4: STIR · sagittal · 4.0mm · 1.02mm/px · 7 of 18 slices shown]
[im 1/18]
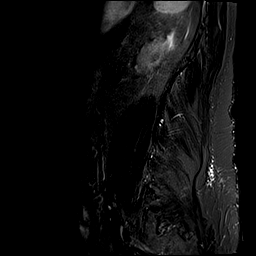
[im 3/18]
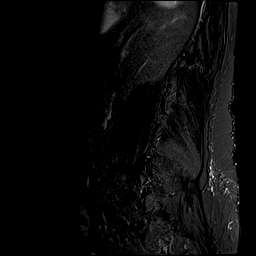
[im 6/18]
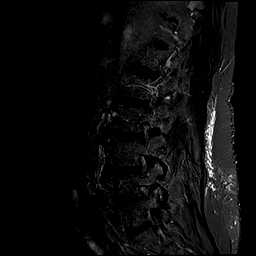
[im 9/18]
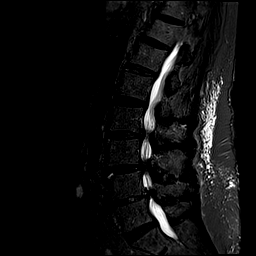
[im 12/18]
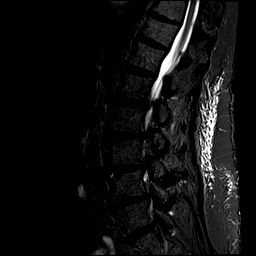
[im 15/18]
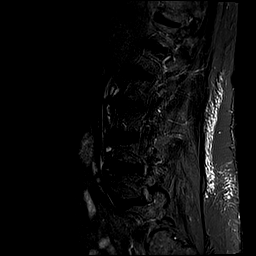
[im 18/18]
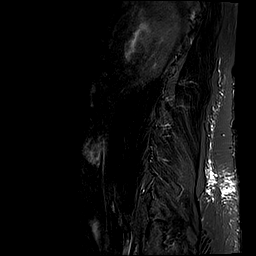

[Series 5: T2 · axial · 4.0mm · 0.78mm/px · z∈[-154,+50]mm · 12 of 39 slices shown (2 of 2)]
[im 1/39]
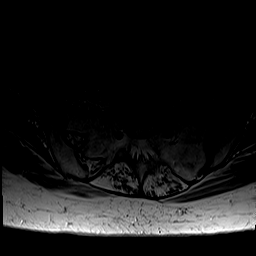
[im 3/39]
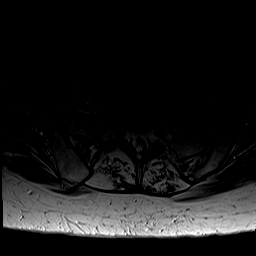
[im 6/39]
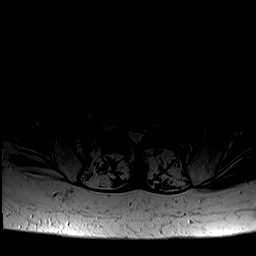
[im 9/39]
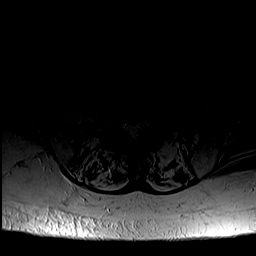
[im 12/39]
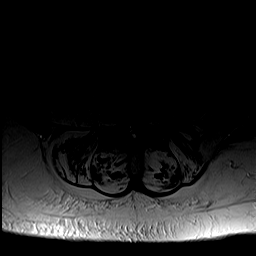
[im 15/39]
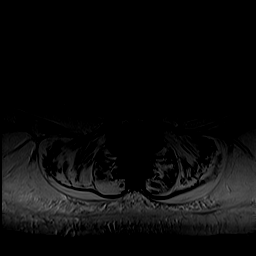
[im 18/39]
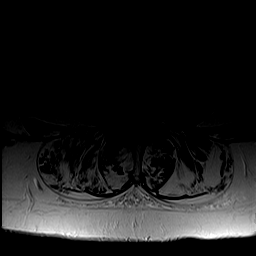
[im 21/39]
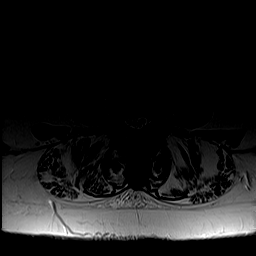
[im 24/39]
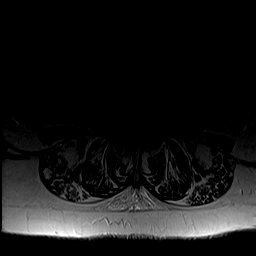
[im 27/39]
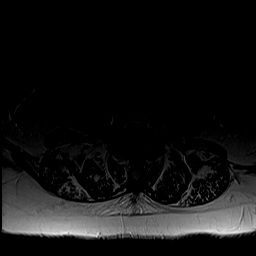
[im 33/39]
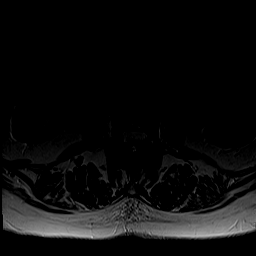
[im 39/39]
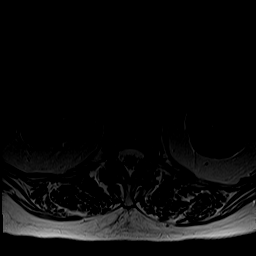

[Series 6: T1 · axial · 4.0mm · 0.39mm/px · z∈[-154,+50]mm · 8 of 39 slices shown (2 of 2)]
[im 1/39]
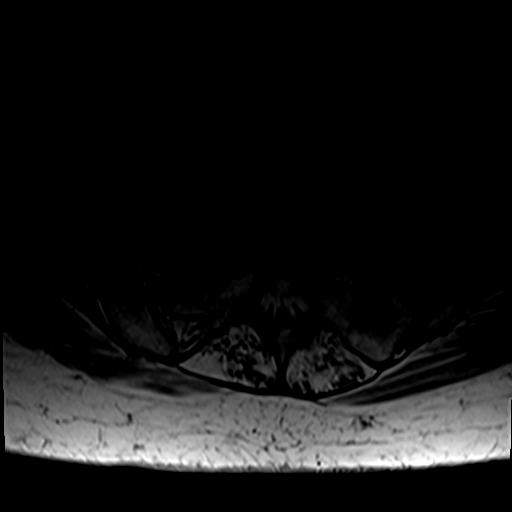
[im 6/39]
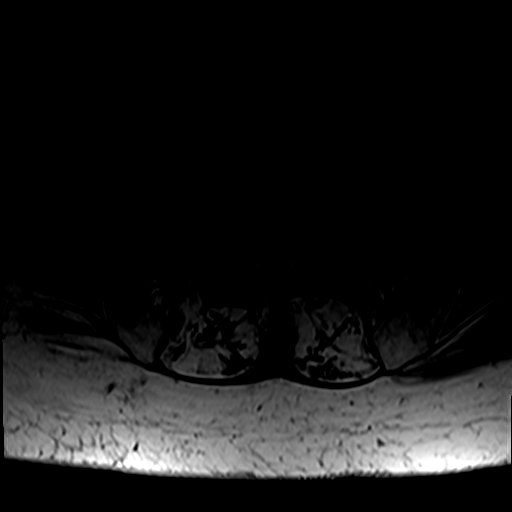
[im 12/39]
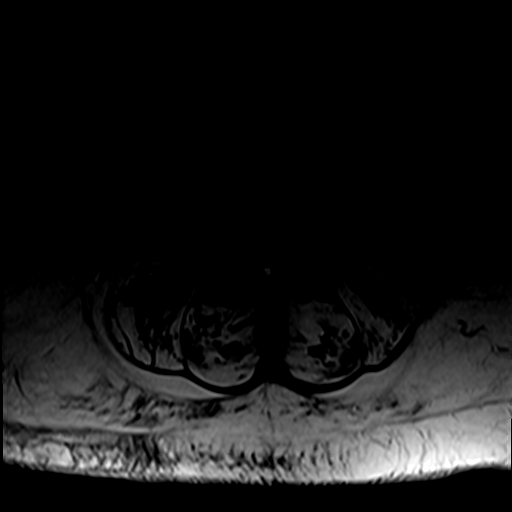
[im 18/39]
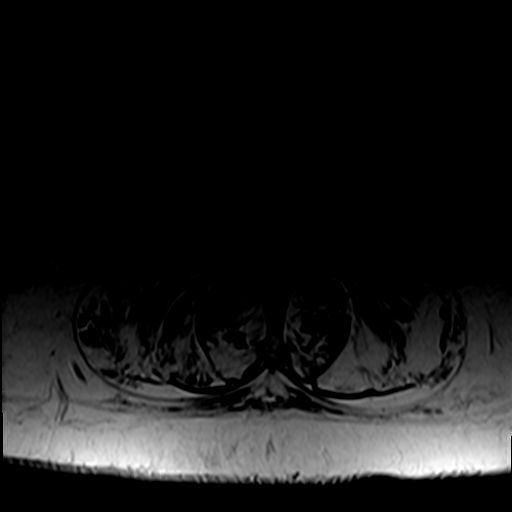
[im 21/39]
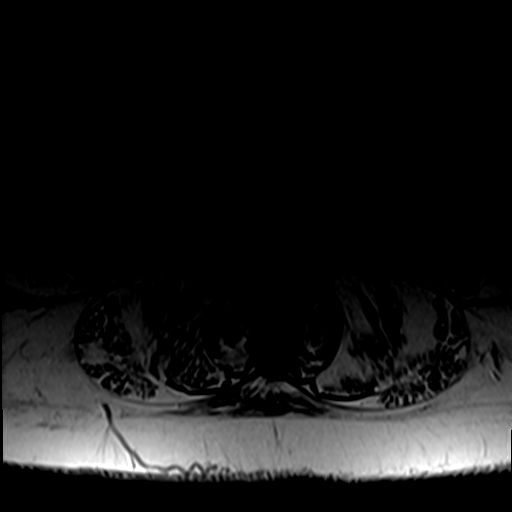
[im 27/39]
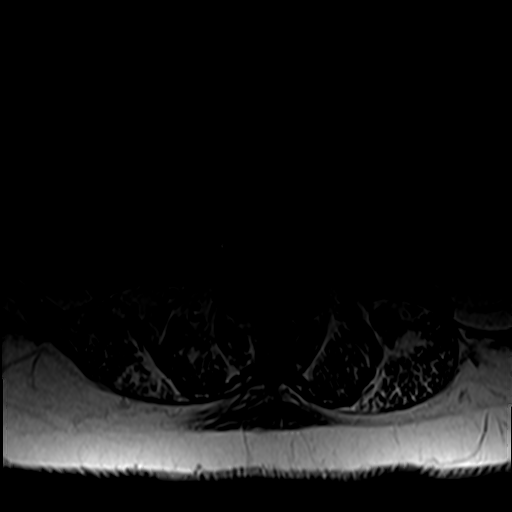
[im 33/39]
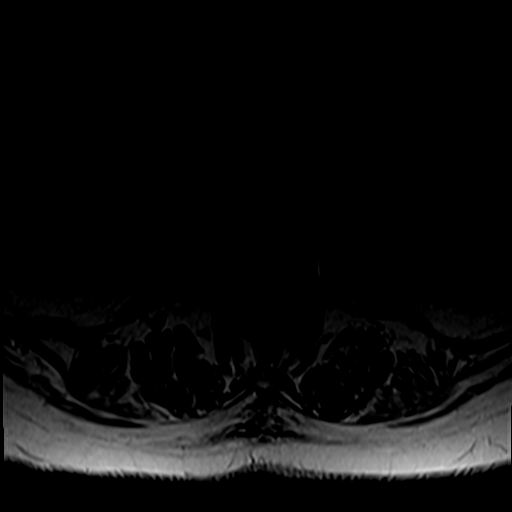
[im 39/39]
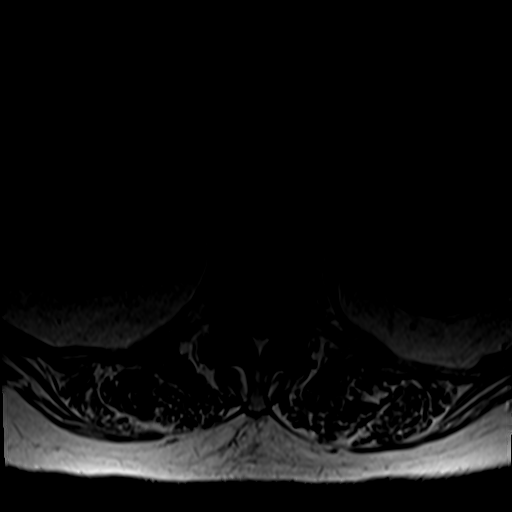

[40 of 48 positions shown; findings below may reference images not displayed]

FINDINGS: For the purposes of this dictation, the lowest well-formed
intervertebral disc space is presumed to be L5-S1.

There is slight anterolisthesis of L3 on L4 and L4 on L5. Vertebral
body heights are preserved. There is diffuse lumbar disc
desiccation. Mild disc space narrowing is present at L2-3 and L3-4
with moderate narrowing at L4-5. There is diffuse narrowing of the
lumbar spinal canal on a congenital basis due to short pedicles. No
vertebral marrow edema is seen. Conus medullaris is normal in signal
and terminates at L1. Paraspinal soft tissues are unremarkable.

T12-L1: Small left foraminal/ extraforaminal disc protrusion and
mild facet hypertrophy without stenosis.

L1-2: Mild circumferential disc bulging, moderate facet and
ligamentum flavum hypertrophy, and congenitally short pedicles
result in moderate spinal stenosis without neural foraminal
stenosis.

L2-3: Mild to moderate circumferential disc bulging, moderate facet
and ligamentum flavum hypertrophy, and congenitally short pedicles
result in severe spinal stenosis and mild bilateral neural foraminal
stenosis.

L3-4: Mild-to-moderate circumferential disc bulging, moderate to
severe facet and ligamentum flavum hypertrophy, and congenitally
short pedicles result in severe spinal stenosis without a residual,
measurable patent thecal sac identified. There is mild right neural
foraminal stenosis.

L4-5: Listhesis with disc uncovering, moderate facet and ligamentum
flavum hypertrophy, and congenitally short pedicles result in severe
spinal stenosis, asymmetric left lateral recess stenosis, and mild
left neural foraminal stenosis.

L5-S1: Shallow central disc protrusion and mild facet hypertrophy
result in mild to moderate right and mild left lateral recess
stenosis without spinal canal or neural foraminal stenosis.
IMPRESSION: Severe multifactorial congenital and acquired spinal stenosis from
L2-3 to L4-5, most severe at L3-4 where the thecal sac is completely
effaced.
